# Patient Record
Sex: Female | Born: 1960
Health system: Southern US, Community
[De-identification: ages and names within clinical notes are randomized; demographics above are authoritative.]

---

## 2008-02-26 ENCOUNTER — Ambulatory Visit (HOSPITAL_BASED_OUTPATIENT_CLINIC_OR_DEPARTMENT_OTHER): Admission: RE | Admit: 2008-02-26 | Discharge: 2008-02-26 | Payer: Self-pay | Admitting: Orthopedic Surgery

## 2008-04-02 ENCOUNTER — Encounter: Admission: RE | Admit: 2008-04-02 | Discharge: 2008-04-23 | Payer: Self-pay | Admitting: Orthopedic Surgery

## 2008-04-10 ENCOUNTER — Ambulatory Visit (HOSPITAL_COMMUNITY): Admission: RE | Admit: 2008-04-10 | Discharge: 2008-04-10 | Payer: Self-pay | Admitting: Family Medicine

## 2008-04-28 ENCOUNTER — Encounter: Admission: RE | Admit: 2008-04-28 | Discharge: 2008-05-15 | Payer: Self-pay | Admitting: Orthopedic Surgery

## 2009-04-12 ENCOUNTER — Encounter: Admission: RE | Admit: 2009-04-12 | Discharge: 2009-04-12 | Payer: Self-pay | Admitting: Family Medicine

## 2009-09-09 ENCOUNTER — Ambulatory Visit (HOSPITAL_COMMUNITY): Payer: Self-pay | Admitting: Marriage and Family Therapist

## 2009-10-06 ENCOUNTER — Ambulatory Visit (HOSPITAL_COMMUNITY): Payer: Self-pay | Admitting: Marriage and Family Therapist

## 2009-10-12 ENCOUNTER — Ambulatory Visit (HOSPITAL_COMMUNITY): Payer: Self-pay | Admitting: Marriage and Family Therapist

## 2009-10-28 ENCOUNTER — Ambulatory Visit (HOSPITAL_COMMUNITY): Payer: Self-pay | Admitting: Marriage and Family Therapist

## 2009-11-15 ENCOUNTER — Ambulatory Visit (HOSPITAL_COMMUNITY): Payer: Self-pay | Admitting: Marriage and Family Therapist

## 2009-11-22 ENCOUNTER — Ambulatory Visit (HOSPITAL_COMMUNITY): Payer: Self-pay | Admitting: Marriage and Family Therapist

## 2010-03-14 ENCOUNTER — Ambulatory Visit (HOSPITAL_COMMUNITY): Payer: Self-pay | Admitting: Marriage and Family Therapist

## 2010-05-15 ENCOUNTER — Encounter: Payer: Self-pay | Admitting: Orthopedic Surgery

## 2010-09-06 NOTE — Op Note (Signed)
[name] NO.:  [id number]   MEDICAL RECORD NO.:  [id number]          PATIENT TYPE:  AMB   LOCATION:  DSC                          FACILITY:  [facility]   PHYSICIAN:  Dyke Brackett, M.D.    DATE OF BIRTH:  05/25/1960   DATE OF PROCEDURE:  02/26/2008  DATE OF DISCHARGE:                               OPERATIVE REPORT   INDICATIONS:  A 50 year old with recurrent lateral epicondylitis only  transiently responding to injection.  MRI consistent with lateral  epicondylitis, __________ outpatient surgery.   PREOPERATIVE DIAGNOSIS:  Recurrent lateral epicondylitis, left elbow.   POSTOPERATIVE DIAGNOSIS:  Recurrent lateral epicondylitis, left elbow.   OPERATION:  1. Left tennis elbow release.  2. Partial lateral epicondylectomy with drilling, left elbow.   SURGEON:  Dyke Brackett, MD   ASSISTANT:  Sharol Given, PA   TOURNIQUET TIME:  Approximately 4 minutes.   DESCRIPTION OF PROCEDURE:  Sterile prep and drape and exsanguination of  the arm, inflation to 250, splitting of the extensor mechanism  longitudinally followed by the identification of degenerative extensor  brevis and debridement of that.  Lateral epicondyle was exposed, partial  lateral epicondylectomy carried out with drilling followed by repair  with 0 Vicryl, subcutaneous tissues with 2-0 and 3-0 Vicryl and Marcaine  with epinephrine __________ the skin, lightly compressive sterile  dressing and sling applied and taken to recovery room in stable  condition.      Dyke Brackett, M.D.  Electronically Signed     WDC/MEDQ  D:  02/26/2008  T:  02/27/2008  Job:  [id number]

## 2010-10-29 ENCOUNTER — Encounter (HOSPITAL_COMMUNITY): Payer: Self-pay

## 2010-10-29 ENCOUNTER — Inpatient Hospital Stay (INDEPENDENT_AMBULATORY_CARE_PROVIDER_SITE_OTHER)
Admission: RE | Admit: 2010-10-29 | Discharge: 2010-10-29 | Disposition: A | Discharge status: Home / Self Care | Payer: 59 | Source: Ambulatory Visit | Attending: Emergency Medicine | Admitting: Emergency Medicine

## 2010-10-29 ENCOUNTER — Ambulatory Visit (INDEPENDENT_AMBULATORY_CARE_PROVIDER_SITE_OTHER): Payer: 59

## 2010-10-29 DIAGNOSIS — S8010XA Contusion of unspecified lower leg, initial encounter: Secondary | ICD-10-CM

## 2011-01-24 LAB — POCT HEMOGLOBIN-HEMACUE: Hemoglobin: 13.3

## 2011-02-08 ENCOUNTER — Ambulatory Visit: Payer: 59 | Attending: Family Medicine | Admitting: Physical Therapy

## 2011-02-08 DIAGNOSIS — M25519 Pain in unspecified shoulder: Secondary | ICD-10-CM | POA: Insufficient documentation

## 2011-02-08 DIAGNOSIS — IMO0001 Reserved for inherently not codable concepts without codable children: Secondary | ICD-10-CM | POA: Insufficient documentation

## 2011-02-15 ENCOUNTER — Ambulatory Visit: Payer: 59 | Admitting: Physical Therapy

## 2012-01-24 ENCOUNTER — Other Ambulatory Visit (HOSPITAL_COMMUNITY): Payer: Self-pay

## 2012-01-24 DIAGNOSIS — Z1231 Encounter for screening mammogram for malignant neoplasm of breast: Secondary | ICD-10-CM

## 2013-08-05 ENCOUNTER — Telehealth: Payer: Self-pay | Admitting: Endocrinology

## 2013-08-05 NOTE — Telephone Encounter (Signed)
Previous records reviewed. She had primary hypothyroidism with minimal increase in TPO antibodies consistent with Hashimoto thyroiditis which was appropriately treated with Armour Thyroid supplement. Patient did not followup as instructed

## 2013-08-05 NOTE — Telephone Encounter (Signed)
FYI  Please see below

## 2013-08-05 NOTE — Telephone Encounter (Signed)
Pt is calling Megan Zavala and threatening Dr. Lucianne MussKumar. Stating that she has never been treated by him for the abnormal labs from 2010, she will have a lawyer contact him, Megan Zavala has been trying to get all of her records to the pt, Megan Zavala just sent the records to her by mail last week. Patient was seen by Dr. Lucianne [facility]Kumar [facility]. [place].

## 2013-10-27 ENCOUNTER — Other Ambulatory Visit (HOSPITAL_BASED_OUTPATIENT_CLINIC_OR_DEPARTMENT_OTHER): Payer: Self-pay | Admitting: *Deleted

## 2013-10-27 ENCOUNTER — Ambulatory Visit (HOSPITAL_BASED_OUTPATIENT_CLINIC_OR_DEPARTMENT_OTHER)
Admission: RE | Admit: 2013-10-27 | Discharge: 2013-10-27 | Disposition: A | Discharge status: Home / Self Care | Source: Ambulatory Visit | Attending: Family Medicine | Admitting: Family Medicine

## 2013-10-27 DIAGNOSIS — E063 Autoimmune thyroiditis: Secondary | ICD-10-CM

## 2014-01-22 ENCOUNTER — Other Ambulatory Visit (HOSPITAL_BASED_OUTPATIENT_CLINIC_OR_DEPARTMENT_OTHER): Payer: Self-pay | Admitting: *Deleted

## 2014-01-22 DIAGNOSIS — Z139 Encounter for screening, unspecified: Secondary | ICD-10-CM

## 2014-01-27 ENCOUNTER — Ambulatory Visit (HOSPITAL_BASED_OUTPATIENT_CLINIC_OR_DEPARTMENT_OTHER)
Admission: RE | Admit: 2014-01-27 | Discharge: 2014-01-27 | Disposition: A | Discharge status: Home / Self Care | Source: Ambulatory Visit | Attending: Family Medicine | Admitting: Family Medicine

## 2014-01-27 DIAGNOSIS — Z1231 Encounter for screening mammogram for malignant neoplasm of breast: Secondary | ICD-10-CM | POA: Insufficient documentation

## 2014-01-27 DIAGNOSIS — Z139 Encounter for screening, unspecified: Secondary | ICD-10-CM

## 2014-01-30 ENCOUNTER — Encounter: Payer: 59 | Admitting: Family Medicine

## 2014-01-30 DIAGNOSIS — Z0289 Encounter for other administrative examinations: Secondary | ICD-10-CM

## 2014-01-30 NOTE — Progress Notes (Signed)
Error   This encounter was created in error - please disregard. 

## 2015-12-05 IMAGING — MG MM DIGITAL SCREENING BILAT W/ CAD
4 series · 4 of 4 positions shown · non-contrast
Comparison: Previous exam(s).

CLINICAL DATA: Screening.

EXAM:
DIGITAL SCREENING BILATERAL MAMMOGRAM WITH CAD

[R CC]
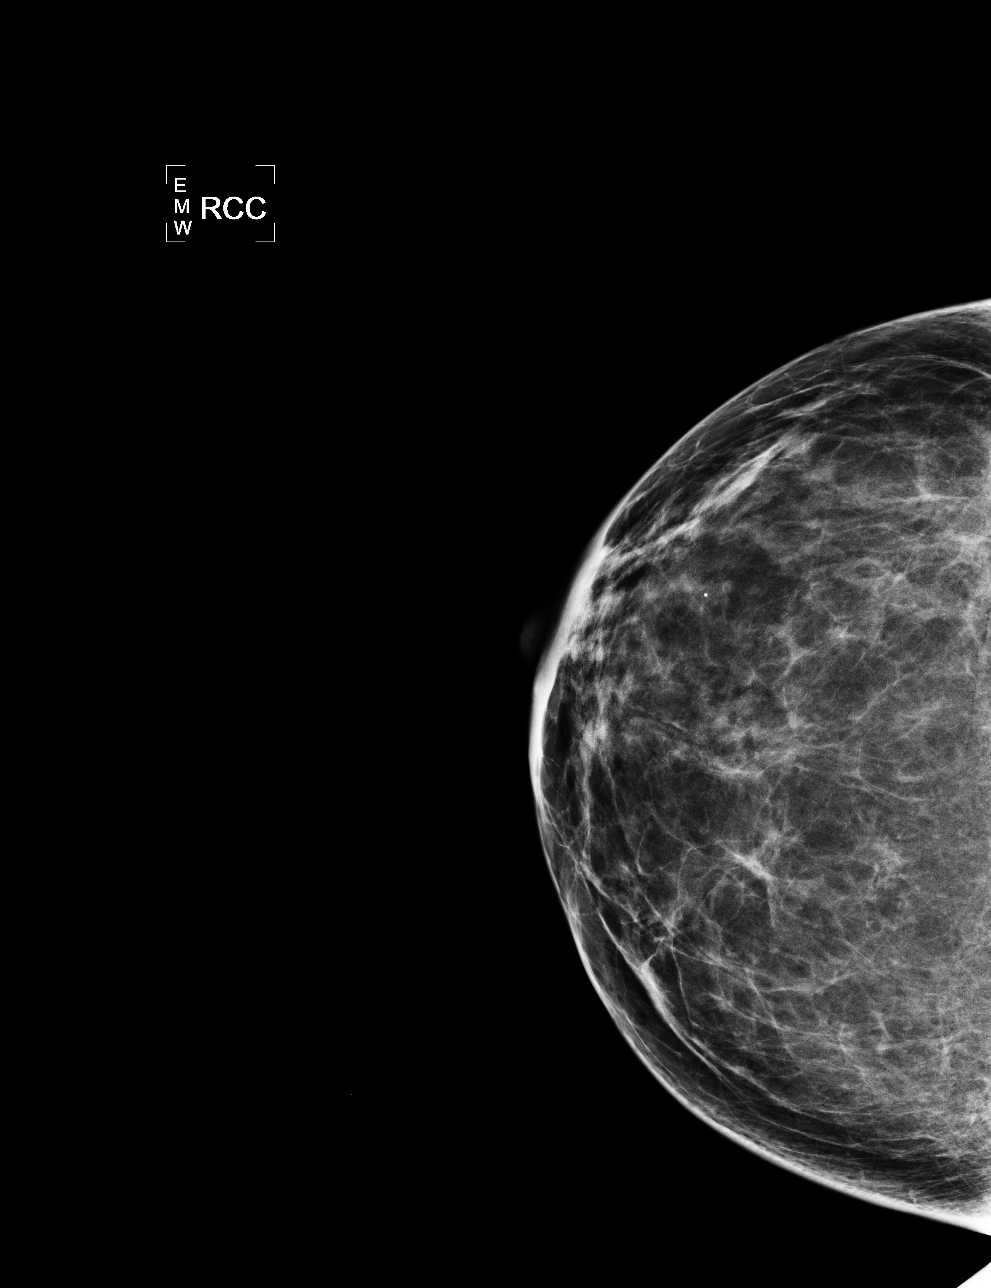

[L CC]
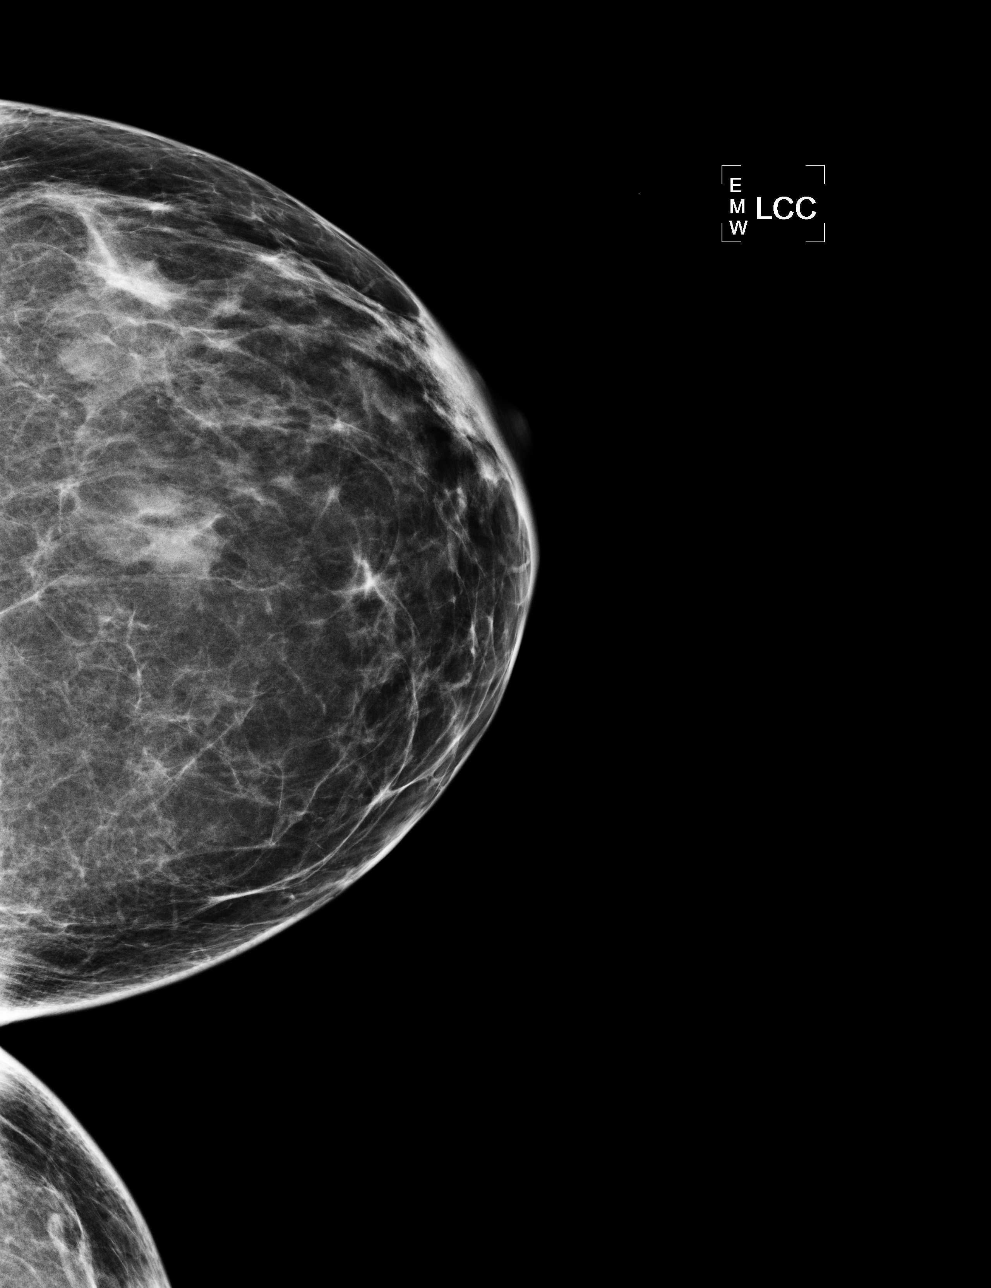

[L MLO]
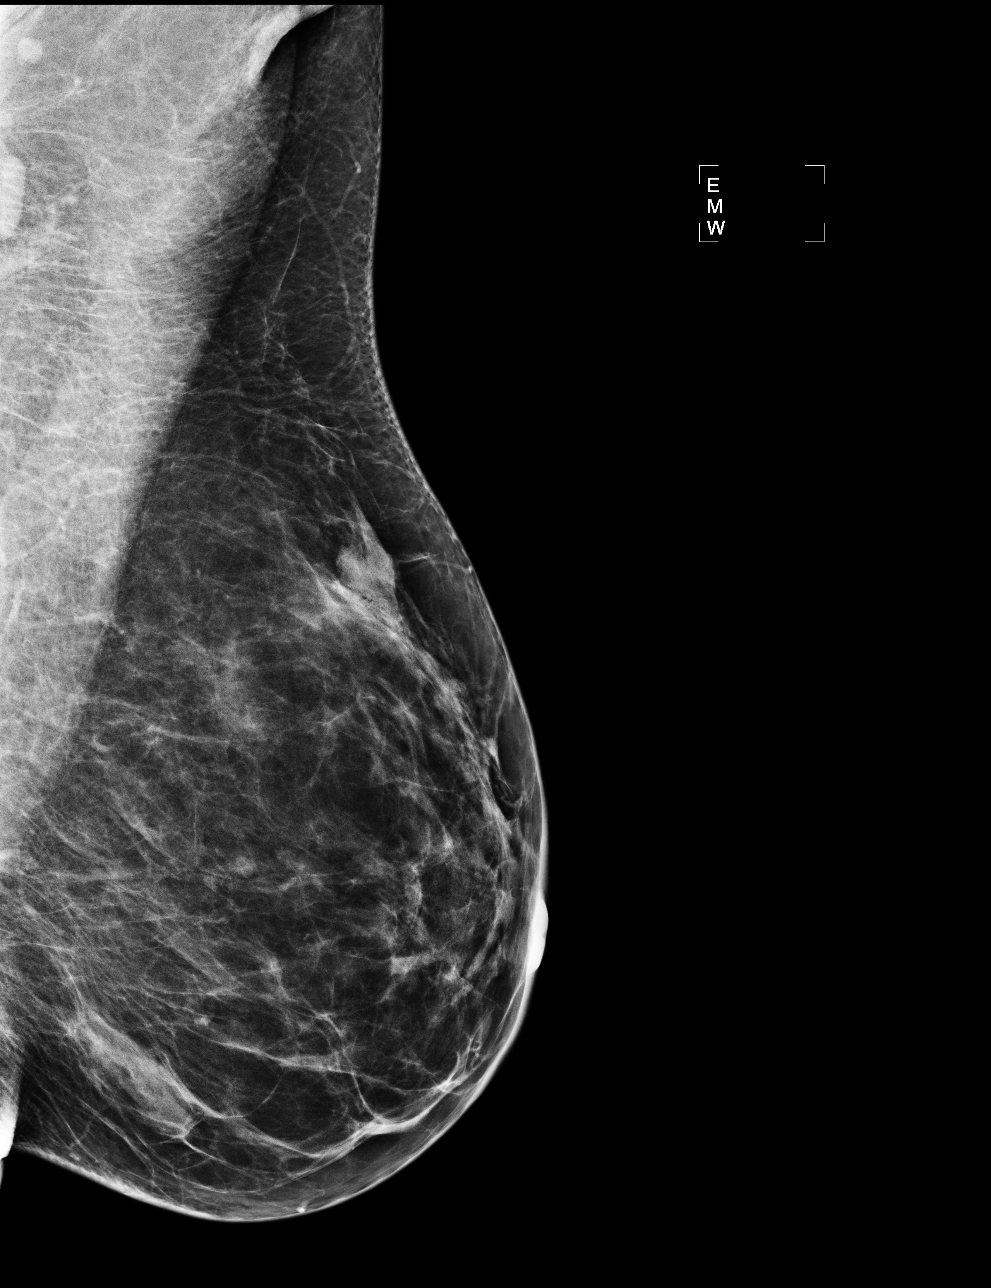

[R MLO]
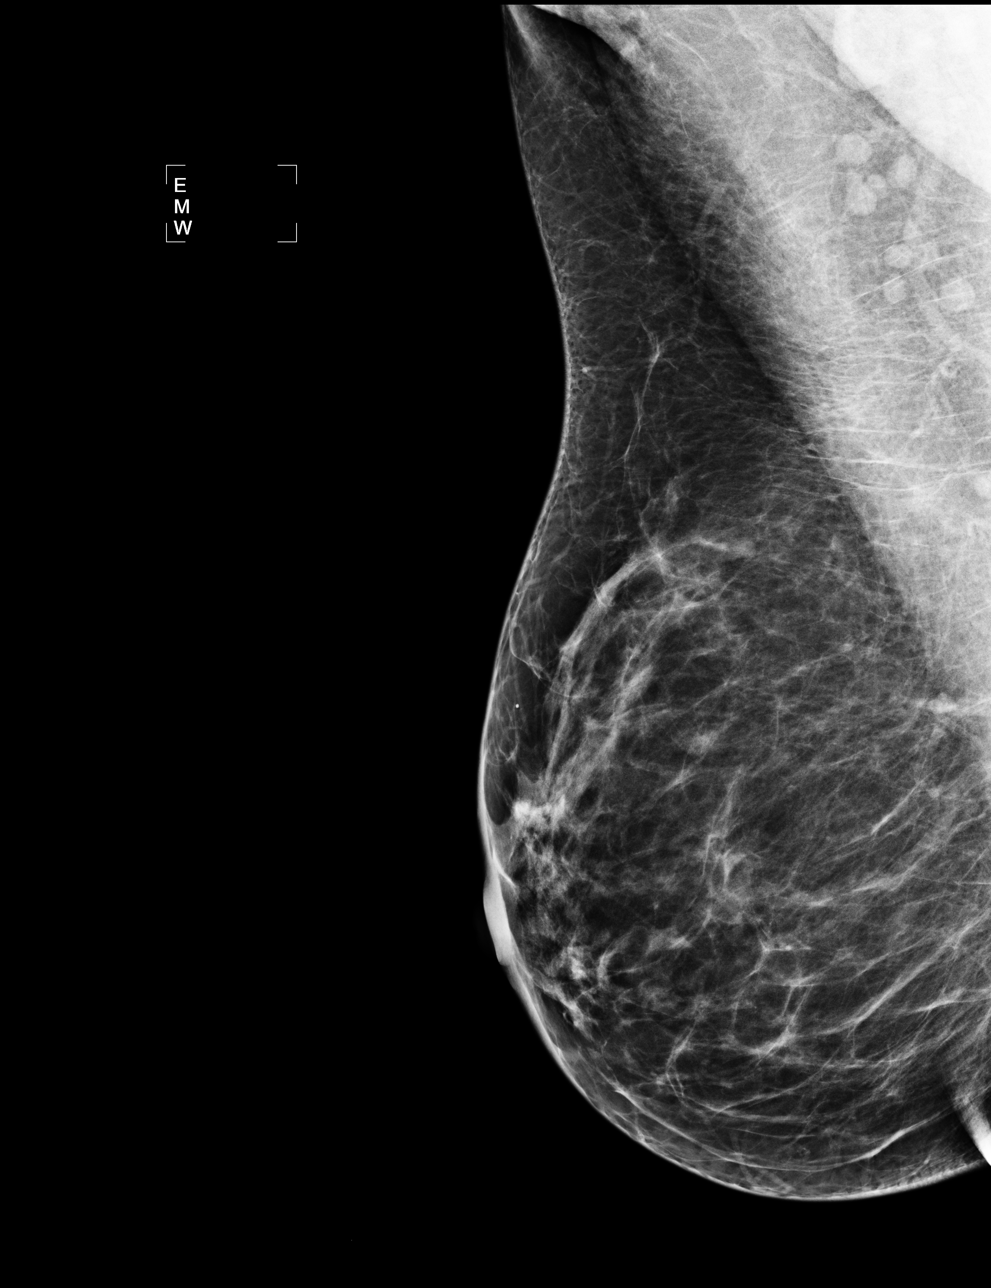

[4 of 4 positions shown; findings below may reference images not displayed]

ACR Breast Density Category b: There are scattered areas of
fibroglandular density.
FINDINGS: There are no findings suspicious for malignancy. Images were
processed with CAD.
IMPRESSION: No mammographic evidence of malignancy. A result letter of this
screening mammogram will be mailed directly to the patient.

RECOMMENDATION:
Screening mammogram in one year. (Code:AS-G-LCT)

BI-RADS CATEGORY  1: Negative.
